# Patient Record
Sex: Male | Born: 2000 | Race: Asian | Hispanic: No | Marital: Single | Smoking: Current some day smoker
Health system: Southern US, Community
[De-identification: ages and names within clinical notes are randomized; demographics above are authoritative.]

## PROBLEM LIST (undated history)

## (undated) DIAGNOSIS — Q250 Patent ductus arteriosus: Secondary | ICD-10-CM

## (undated) HISTORY — PX: OTHER SURGICAL HISTORY: SHX169

---
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
## 2019-06-30 ENCOUNTER — Other Ambulatory Visit: Payer: Self-pay

## 2019-06-30 ENCOUNTER — Emergency Department (HOSPITAL_BASED_OUTPATIENT_CLINIC_OR_DEPARTMENT_OTHER)
Admission: EM | Admit: 2019-06-30 | Discharge: 2019-06-30 | Disposition: A | Discharge status: Home / Self Care | Payer: Managed Care, Other (non HMO) | Attending: Emergency Medicine | Admitting: Emergency Medicine

## 2019-06-30 ENCOUNTER — Encounter (HOSPITAL_BASED_OUTPATIENT_CLINIC_OR_DEPARTMENT_OTHER): Payer: Self-pay | Admitting: *Deleted

## 2019-06-30 ENCOUNTER — Emergency Department (HOSPITAL_BASED_OUTPATIENT_CLINIC_OR_DEPARTMENT_OTHER): Payer: Managed Care, Other (non HMO)

## 2019-06-30 DIAGNOSIS — Y92310 Basketball court as the place of occurrence of the external cause: Secondary | ICD-10-CM | POA: Diagnosis not present

## 2019-06-30 DIAGNOSIS — W010XXA Fall on same level from slipping, tripping and stumbling without subsequent striking against object, initial encounter: Secondary | ICD-10-CM | POA: Insufficient documentation

## 2019-06-30 DIAGNOSIS — Y999 Unspecified external cause status: Secondary | ICD-10-CM | POA: Diagnosis not present

## 2019-06-30 DIAGNOSIS — Y9367 Activity, basketball: Secondary | ICD-10-CM | POA: Insufficient documentation

## 2019-06-30 DIAGNOSIS — S93412A Sprain of calcaneofibular ligament of left ankle, initial encounter: Secondary | ICD-10-CM | POA: Diagnosis not present

## 2019-06-30 DIAGNOSIS — S99912A Unspecified injury of left ankle, initial encounter: Secondary | ICD-10-CM | POA: Diagnosis present

## 2019-06-30 HISTORY — DX: Patent ductus arteriosus: Q25.0

## 2019-06-30 NOTE — Discharge Instructions (Addendum)
You may alternate Tylenol 1000 mg every 6 hours as needed for pain and Ibuprofen 800 mg every 8 hours as needed for pain.  Please take Ibuprofen with food.  These medications are over the counter.   You may use crutches as needed for pain.  It is okay to walk on this foot at any time.   You have tiny chip fracture to the lateral malleolus (outside ankle) from an ankle sprain.  You do not need a cast or surgery.   If symptoms or not improving in 1 to 2 weeks, you should follow-up with orthopedics as an outpatient.

## 2019-06-30 NOTE — ED Provider Notes (Signed)
TIME SEEN: 11:37 PM  CHIEF COMPLAINT: Left ankle injury  HPI: Patient is a 18 year old male who presents to the emergency department with left ankle injury.  States he was playing basketball and went for a lay up when he came down on someone else's foot and inverted his left foot and ankle.  Has pain over the left lateral malleolus with mild swelling.  [place] to the ground.  Did not hit his head or lose consciousness.  No neck or back pain.  No chest or abdominal pain.  Has been able to ambulate.  ROS: See HPI Constitutional: no fever  Eyes: no drainage  ENT: no runny nose   Cardiovascular:  no chest pain  Resp: no SOB  GI: no vomiting GU: no dysuria Integumentary: no rash  Allergy: no hives  Musculoskeletal: no leg swelling  Neurological: no slurred speech ROS otherwise negative  PAST MEDICAL HISTORY/PAST SURGICAL HISTORY:  Past Medical History:  Diagnosis Date  . Patent ductus arteriosus     MEDICATIONS:  Prior to Admission medications   Not on File    ALLERGIES:  No Known Allergies  SOCIAL HISTORY:  Social History   Tobacco Use  . Smoking status: Never Smoker  . Smokeless tobacco: Never Used  Substance Use Topics  . Alcohol use: No    FAMILY HISTORY: Family History  Adopted: Yes  Family history unknown: Yes    EXAM: BP 127/72   Pulse 84   Temp 99.2 F (37.3 C) (Oral)   Resp 14   Ht 5' 10.5" (1.791 m)   Wt 54.9 kg   SpO2 100%   BMI 17.12 kg/m  CONSTITUTIONAL: Alert and oriented and responds appropriately to questions. Well-appearing; well-nourished HEAD: Normocephalic, atraumatic EYES: Conjunctivae clear, pupils appear equal ENT: normal nose; moist mucous membranes NECK: Supple, no midline spinal tenderness or step-off or deformity CARD: RRR; chest is nontender to palpation RESP: Normal chest excursion without splinting or tachypnea;  no hypoxia or respiratory distress, speaking full sentences ABD/GI: non-distended; soft, non-tender, no rebound, no  guarding, no peritoneal signs, no hepatosplenomegaly BACK:  The back appears normal and is non-tender to palpation, no midline spinal tenderness or step-off or deformity EXT: Patient is tender to palpation over the lateral malleolus with mild soft tissue swelling without ecchymosis.  No tenderness over the left foot, left knee, left hip.  No significant ligamentous laxity noted to the left ankle.  2+ left DP pulse.  Normal sensation throughout the left leg with normal movement of the left leg.    SKIN: Normal color for age and race; warm; no rash NEURO: Moves all extremities equally, normal speech PSYCH: The patient's mood and manner are appropriate. Grooming and personal hygiene are appropriate.  MEDICAL DECISION MAKING: Patient here with left ankle injury.  Has a tiny avulsion fracture from the inferior tip of the lateral malleolus.  Discussed with patient and family at bedside that this is likely from a sprain.  He has no significant ligamentous laxity on exam.  Neurovascular intact distally.  Will place an air splint and provide crutches for comfort.  Will provide work note for the next several days so that he can rest, keep leg elevated and apply ice as needed.  Recommended alternating Tylenol and Motrin.  No other injury on exam.  Given outpatient orthopedic follow-up if symptoms not improving in 1 to 2 weeks with medical therapy.  At this time, I do not feel there is any life-threatening condition present. I have reviewed and discussed  all results (EKG, imaging, lab, urine as appropriate) and exam findings with patient/family. I have reviewed nursing notes and appropriate previous records.  I feel the patient is safe to be discharged home without further emergent workup and can continue workup as an outpatient as needed. Discussed usual and customary return precautions. Patient/family verbalize understanding and are comfortable with this plan.  Outpatient follow-up has been provided as needed. All  questions have been answered.    SPLINT APPLICATION Date/Time: 11:46 PM Authorized by: Baxter Hire Briteny Fulghum Consent: Verbal consent obtained. Risks and benefits: risks, benefits and alternatives were discussed Consent given by: patient Splint applied by: technician Location details: left ankle Splint type: air splint Supplies used: air splint Post-procedure: The splinted body part was neurovascularly unchanged following the procedure. Patient tolerance: Patient tolerated the procedure well with no immediate complications.     Larry Boyd was evaluated in Emergency Department on 06/30/2019 for the symptoms described in the history of present illness. He was evaluated in the context of the global COVID-19 pandemic, which necessitated consideration that the patient might be at risk for infection with the SARS-CoV-2 virus that causes COVID-19. Institutional protocols and algorithms that pertain to the evaluation of patients at risk for COVID-19 are in a state of rapid change based on information released by regulatory bodies including the CDC and federal and state organizations. These policies and algorithms were followed during the patient's care in the ED.    Markasia Carrol, Layla Maw, DO 06/30/19 2346

## 2019-06-30 NOTE — ED Triage Notes (Signed)
He was playing basketball tonight and twisted his left ankle. Swelling and pain.

## 2020-10-01 ENCOUNTER — Emergency Department (HOSPITAL_BASED_OUTPATIENT_CLINIC_OR_DEPARTMENT_OTHER)
Admission: EM | Admit: 2020-10-01 | Discharge: 2020-10-01 | Disposition: A | Discharge status: Home / Self Care | Payer: Managed Care, Other (non HMO) | Attending: Emergency Medicine | Admitting: Emergency Medicine

## 2020-10-01 ENCOUNTER — Emergency Department (HOSPITAL_BASED_OUTPATIENT_CLINIC_OR_DEPARTMENT_OTHER): Payer: Managed Care, Other (non HMO)

## 2020-10-01 ENCOUNTER — Encounter (HOSPITAL_BASED_OUTPATIENT_CLINIC_OR_DEPARTMENT_OTHER): Payer: Self-pay | Admitting: Emergency Medicine

## 2020-10-01 ENCOUNTER — Other Ambulatory Visit: Payer: Self-pay

## 2020-10-01 DIAGNOSIS — F17200 Nicotine dependence, unspecified, uncomplicated: Secondary | ICD-10-CM | POA: Insufficient documentation

## 2020-10-01 DIAGNOSIS — W010XXA Fall on same level from slipping, tripping and stumbling without subsequent striking against object, initial encounter: Secondary | ICD-10-CM | POA: Diagnosis not present

## 2020-10-01 DIAGNOSIS — S8264XA Nondisplaced fracture of lateral malleolus of right fibula, initial encounter for closed fracture: Secondary | ICD-10-CM | POA: Diagnosis not present

## 2020-10-01 DIAGNOSIS — S99911A Unspecified injury of right ankle, initial encounter: Secondary | ICD-10-CM | POA: Diagnosis present

## 2020-10-01 MED ORDER — HYDROCODONE-ACETAMINOPHEN 5-325 MG PO TABS
1.0000 | ORAL_TABLET | Freq: Once | ORAL | Status: AC
  Administered 2020-10-01: 1 via ORAL
  Filled 2020-10-01: qty 1

## 2020-10-01 MED ORDER — NAPROXEN 500 MG PO TABS: 500.0000 mg | ORAL_TABLET | Freq: Two times a day (BID) | ORAL | 0 refills | Status: DC

## 2020-10-01 MED ORDER — NAPROXEN 500 MG PO TABS: 500.0000 mg | ORAL_TABLET | Freq: Two times a day (BID) | ORAL | 0 refills | Status: AC

## 2020-10-01 NOTE — ED Triage Notes (Signed)
Reports rolling right ankle earlier this morning, states wasn't paying attention while talking with friends. Pain with weight bearing.

## 2020-10-01 NOTE — ED Provider Notes (Signed)
[facility] EMERGENCY DEPARTMENT Provider Note   CSN: [id number] Arrival date & time: 10/01/20  0548     History Chief Complaint  Patient presents with  . Ankle Pain    Larry Boyd is a 20 y.o. male.  HPI     This is a 20 year old male with no significant past medical history who presents with right ankle injury.  Patient reports that he misstepped when leaving work this morning.  He turned his right ankle and fell onto a rock.  He did not hit his head or lose consciousness.  He rates his pain at 4 out of 10 when not weightbearing.  He states significant pain with weightbearing.  No numbness or tingling of the foot.  He has not taken anything for his pain.  Denies knee pain  Past Medical History:  Diagnosis Date  . Patent ductus arteriosus     Patient Active Problem List   Diagnosis Date Noted  . Closed fracture of middle phalanx of second finger of right hand 09/29/2013    Past Surgical History:  Procedure Laterality Date  . PAD closure         Family History  Adopted: Yes  Family history unknown: Yes    Social History   Tobacco Use  . Smoking status: Current Some Day Smoker  . Smokeless tobacco: Never Used  Substance Use Topics  . Alcohol use: No    Home Medications Prior to Admission medications   Medication Sig Start Date End Date Taking? Authorizing Provider  naproxen (NAPROSYN) 500 MG tablet Take 1 tablet (500 mg total) by mouth 2 (two) times daily. 10/01/20   Bryttani Blew, Mayer Masker, MD    Allergies    Patient has no known allergies.  Review of Systems   Review of Systems  Musculoskeletal:       Right ankle pain  Neurological: Negative for weakness and numbness.  All other systems reviewed and are negative.   Physical Exam Updated Vital Signs BP 125/74 (BP Location: Right Arm)   Pulse 76   Temp 98.6 F (37 C) (Oral)   Resp 20   Ht 1.803 m (5\' 11" )   Wt 61.2 kg   SpO2 100%   BMI 18.83 kg/m   Physical Exam Vitals and  nursing note reviewed.  Constitutional:      Appearance: He is well-developed and well-nourished. He is not ill-appearing.  HENT:     Head: Normocephalic and atraumatic.     Mouth/Throat:     Mouth: Mucous membranes are moist.  Eyes:     Pupils: Pupils are equal, round, and reactive to light.  Cardiovascular:     Rate and Rhythm: Normal rate and regular rhythm.  Pulmonary:     Effort: Pulmonary effort is normal. No respiratory distress.  Abdominal:     Palpations: Abdomen is soft.  Musculoskeletal:        General: No edema.     Cervical back: Neck supple.     Comments: Focused examination of the right lower extremity with tenderness palpation over the lateral malleolus, slight deformity noted, abrasion noted over the medial aspect of the foot, 2+ DP pulse, limited range of motion, no proximal fibular tenderness  Lymphadenopathy:     Cervical: No cervical adenopathy.  Skin:    General: Skin is warm and dry.  Neurological:     Mental Status: He is alert and oriented to person, place, and time.  Psychiatric:        Mood and  Affect: Mood and affect and mood normal.     ED Results / Procedures / Treatments   Labs (all labs ordered are listed, but only abnormal results are displayed) Labs Reviewed - No data to display  EKG None  Radiology DG Ankle Complete Right  Result Date: 10/01/2020 CLINICAL DATA:  Right ankle injury. EXAM: RIGHT ANKLE - COMPLETE 3+ VIEW COMPARISON:  None. FINDINGS: Nondisplaced lateral malleolus fracture with soft tissue swelling and ankle joint effusion. No subluxation. IMPRESSION: 1. Nondisplaced lateral malleolus fracture. 2. Soft tissue swelling and ankle joint effusion. Electronically Signed   By: Marnee Spring M.D.   On: 10/01/2020 06:31    Procedures Procedures   Medications Ordered in ED Medications  HYDROcodone-acetaminophen (NORCO/VICODIN) 5-325 MG per tablet 1 tablet (1 tablet Oral Given 10/01/20 4765)    ED Course  I have reviewed the  triage vital signs and the nursing notes.  Pertinent labs & imaging results that were available during my care of the patient were reviewed by me and considered in my medical decision making (see chart for details).    MDM Rules/Calculators/A&P                           Patient presents with an injury to the right ankle.  He is overall nontoxic and vital signs are reassuring.  He has some swelling slight deformity over the lateral malleolus.  Sprain versus fracture.  X-rays obtained and independently reviewed by myself.  He has a nondisplaced lateral malleolus fracture below the ankle mortise.  Patient was placed in a cam walker.  Recommended nonweightbearing and crutches until he follows up with sports medicine.  This is likely nonoperative injury.  After history, exam, and medical workup I feel the patient has been appropriately medically screened and is safe for discharge home. Pertinent diagnoses were discussed with the patient. Patient was given return precautions.   Final Clinical Impression(s) / ED Diagnoses Final diagnoses:  Nondisplaced fracture of lateral malleolus of right fibula, initial encounter for closed fracture    Rx / DC Orders ED Discharge Orders         Ordered    naproxen (NAPROSYN) 500 MG tablet  2 times daily,   Status:  Discontinued        10/01/20 0643    naproxen (NAPROSYN) 500 MG tablet  2 times daily        10/01/20 0657           Shon Baton, MD 10/01/20 2315

## 2020-10-01 NOTE — Discharge Instructions (Addendum)
You were seen today and found to have a fracture of the ankle.  It is nondisplaced.  Keep CAM Walker in place and use crutches until you follow-up with sports medicine.  This is a nonoperative fracture.  Take naproxen as needed for pain.

## 2020-10-09 ENCOUNTER — Telehealth: Payer: Self-pay | Admitting: Family Medicine

## 2020-10-09 NOTE — Telephone Encounter (Signed)
Unsuccessful attempt to reach pt for ED f/u appt scheduling--will cb later.  --glh

## 2020-11-02 IMAGING — DX DG ANKLE COMPLETE 3+V*L*
3 series · 3 of 3 positions shown · non-contrast
Comparison: None.

CLINICAL DATA: Twisted left ankle playing basketball tonight. Left
ankle pain and swelling. Initial encounter.

EXAM:
LEFT ANKLE COMPLETE - 3+ VIEW

[ankle ap]
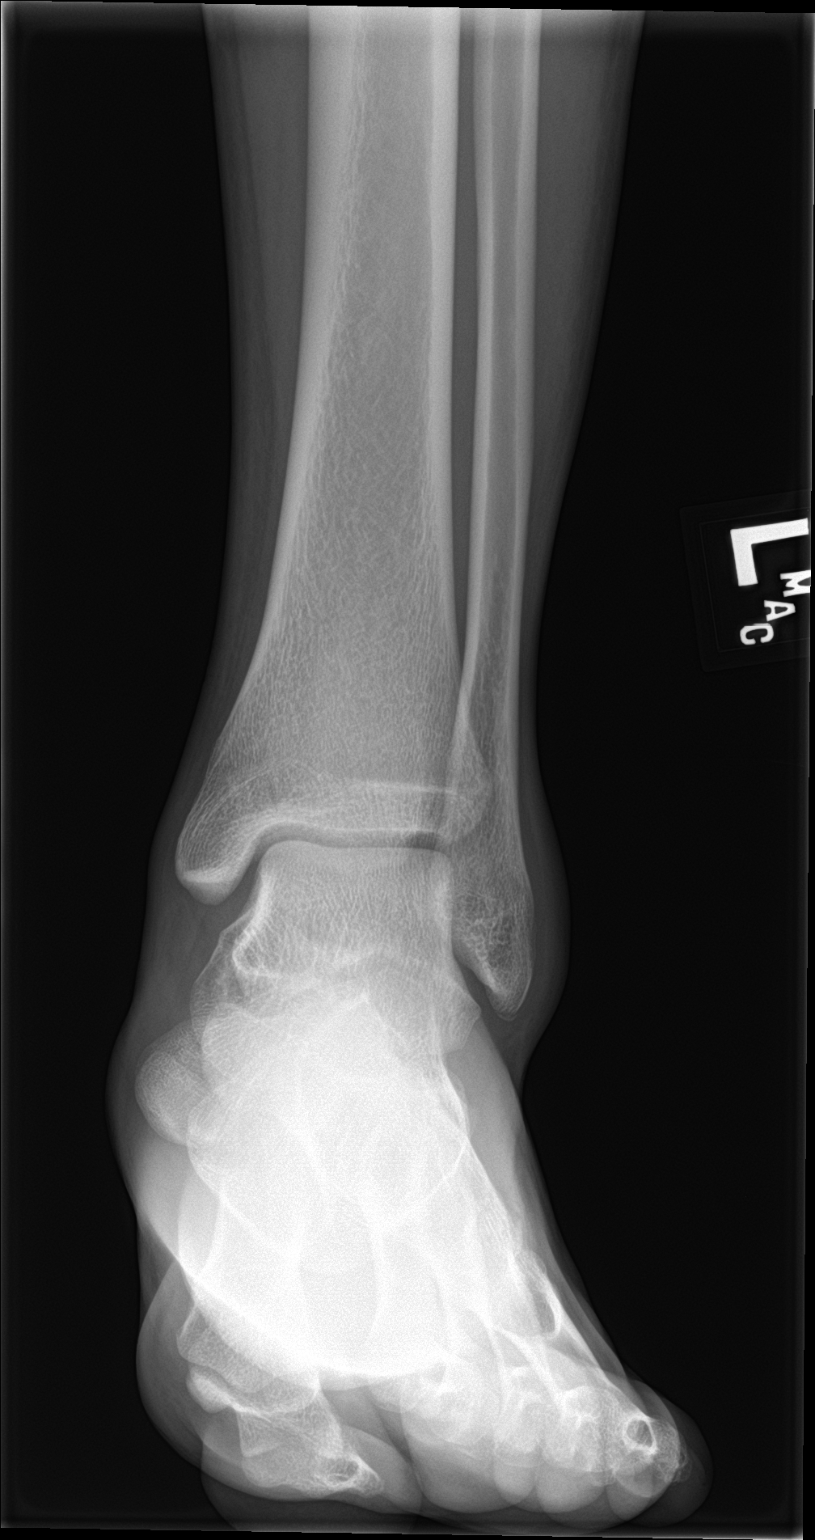

[ankle obl]
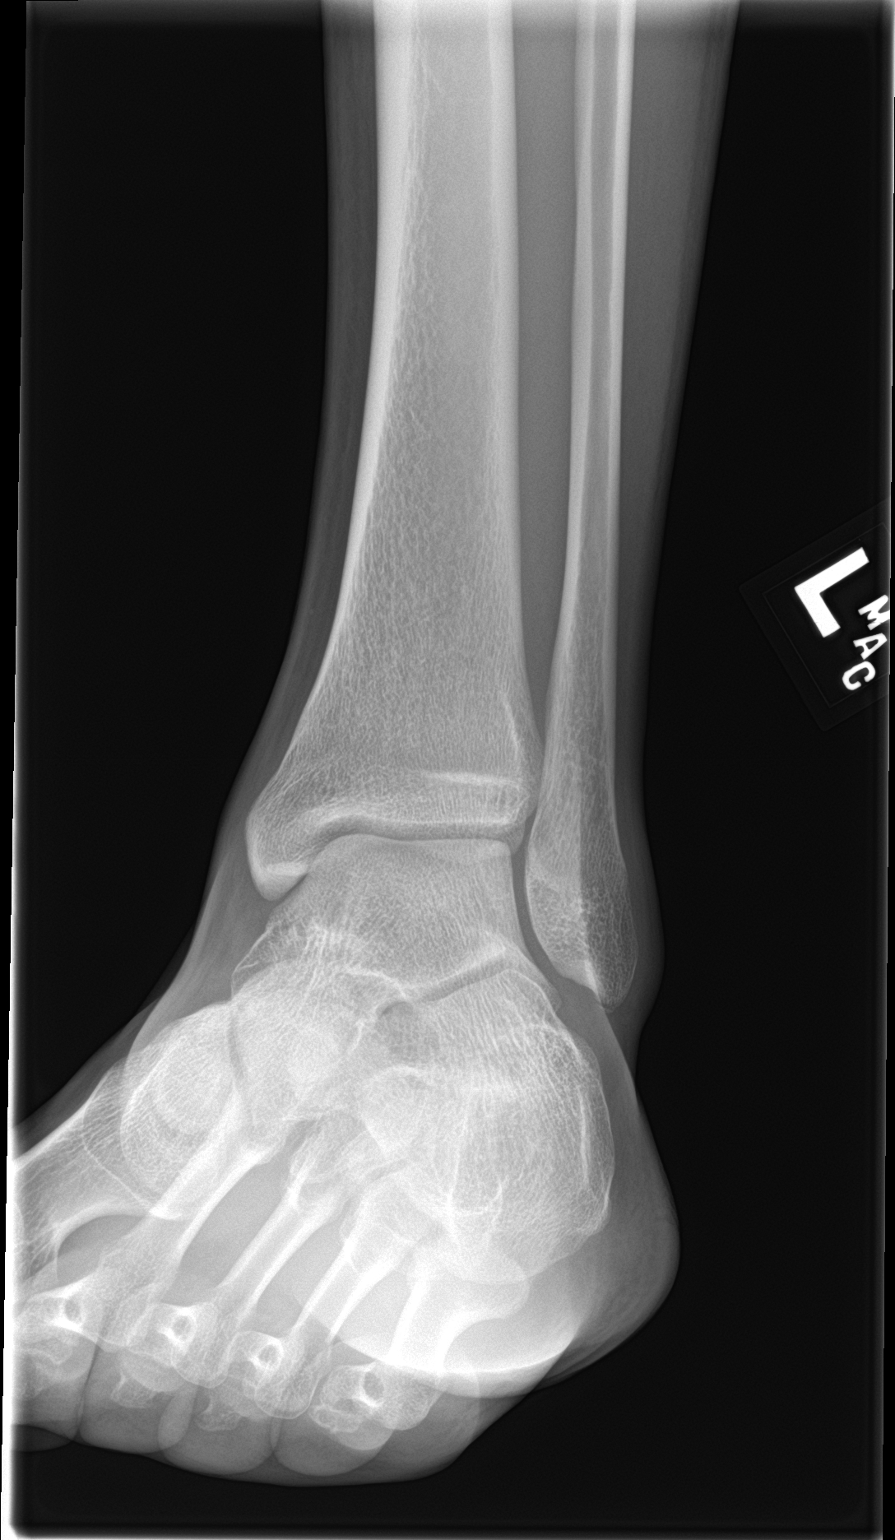

[ankle lat]
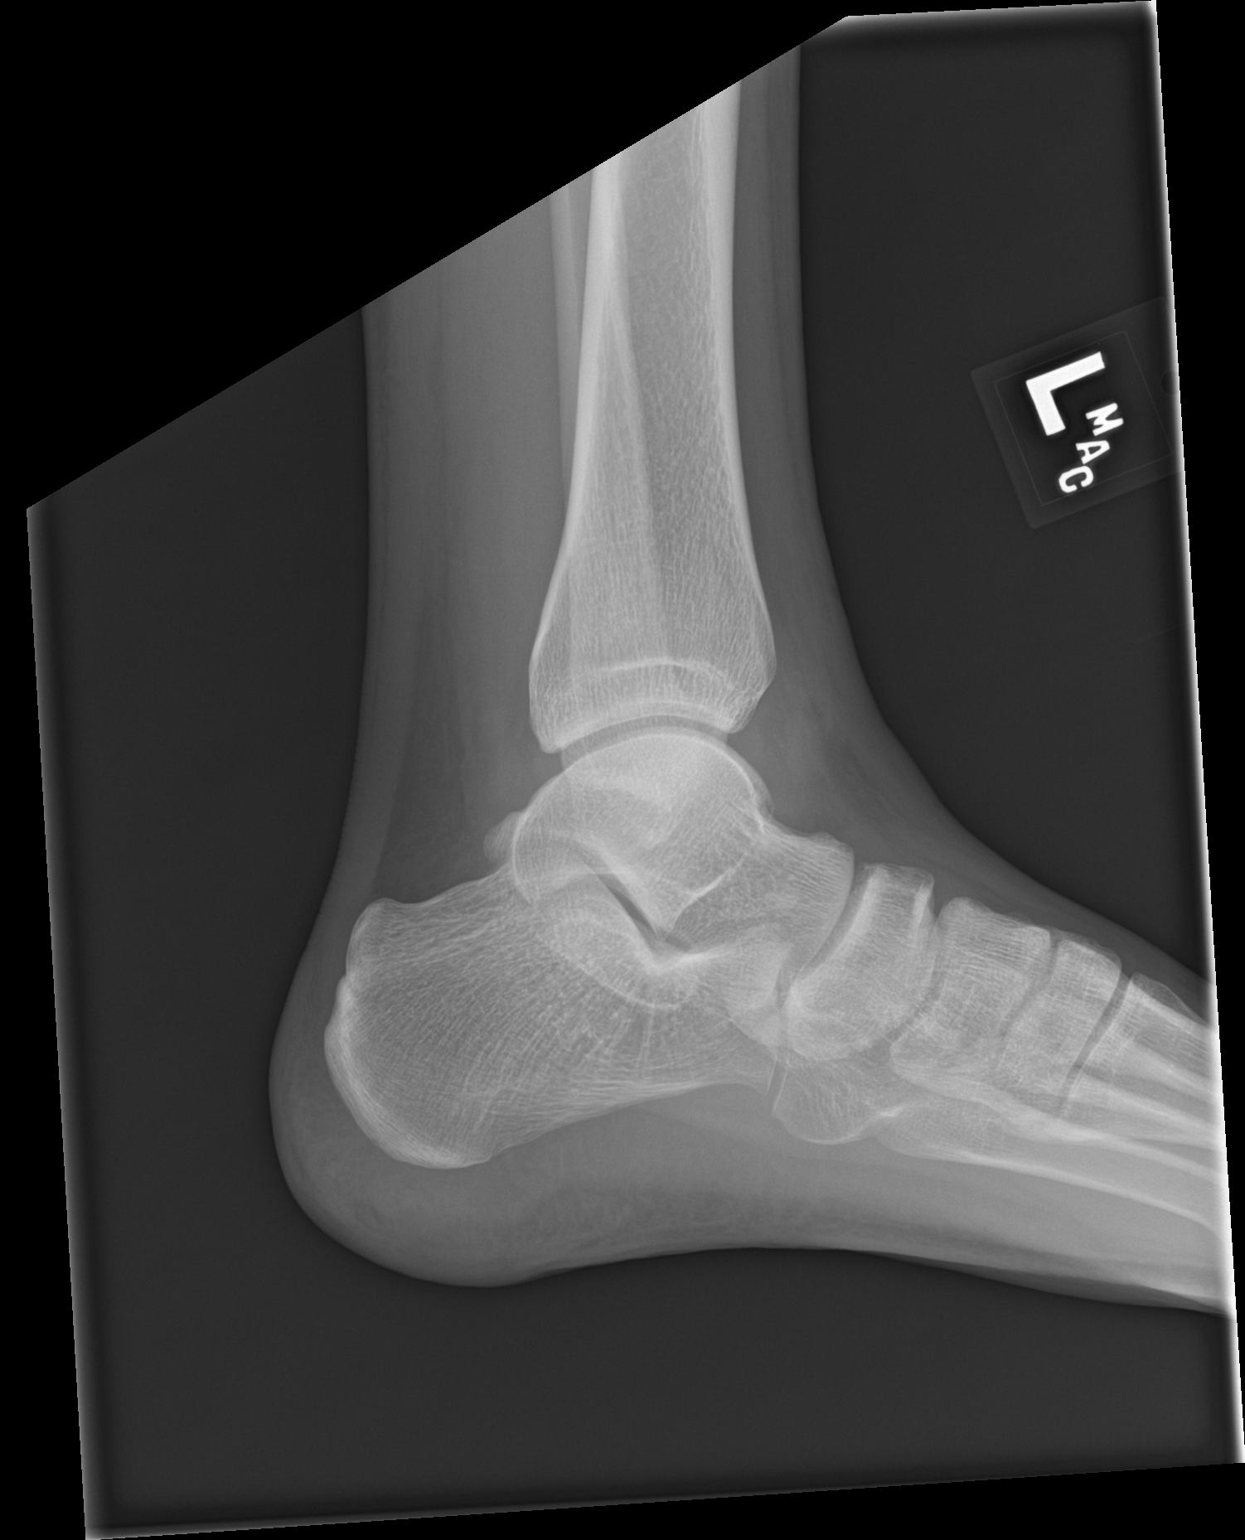

[3 of 3 positions shown; findings below may reference images not displayed]

FINDINGS: A tiny ossific density is seen adjacent to the inferior tip of the
lateral malleolus, consistent with a tiny avulsion fracture
fragment. No other fractures are identified. No evidence of
dislocation. No other bone lesions identified.
IMPRESSION: Tiny avulsion fracture from inferior tip of lateral malleolus.

## 2022-02-04 IMAGING — DX DG ANKLE COMPLETE 3+V*R*
3 series · 3 of 3 positions shown · non-contrast
Comparison: None.

CLINICAL DATA: Right ankle injury.

EXAM:
RIGHT ANKLE - COMPLETE 3+ VIEW

[ankle ap]
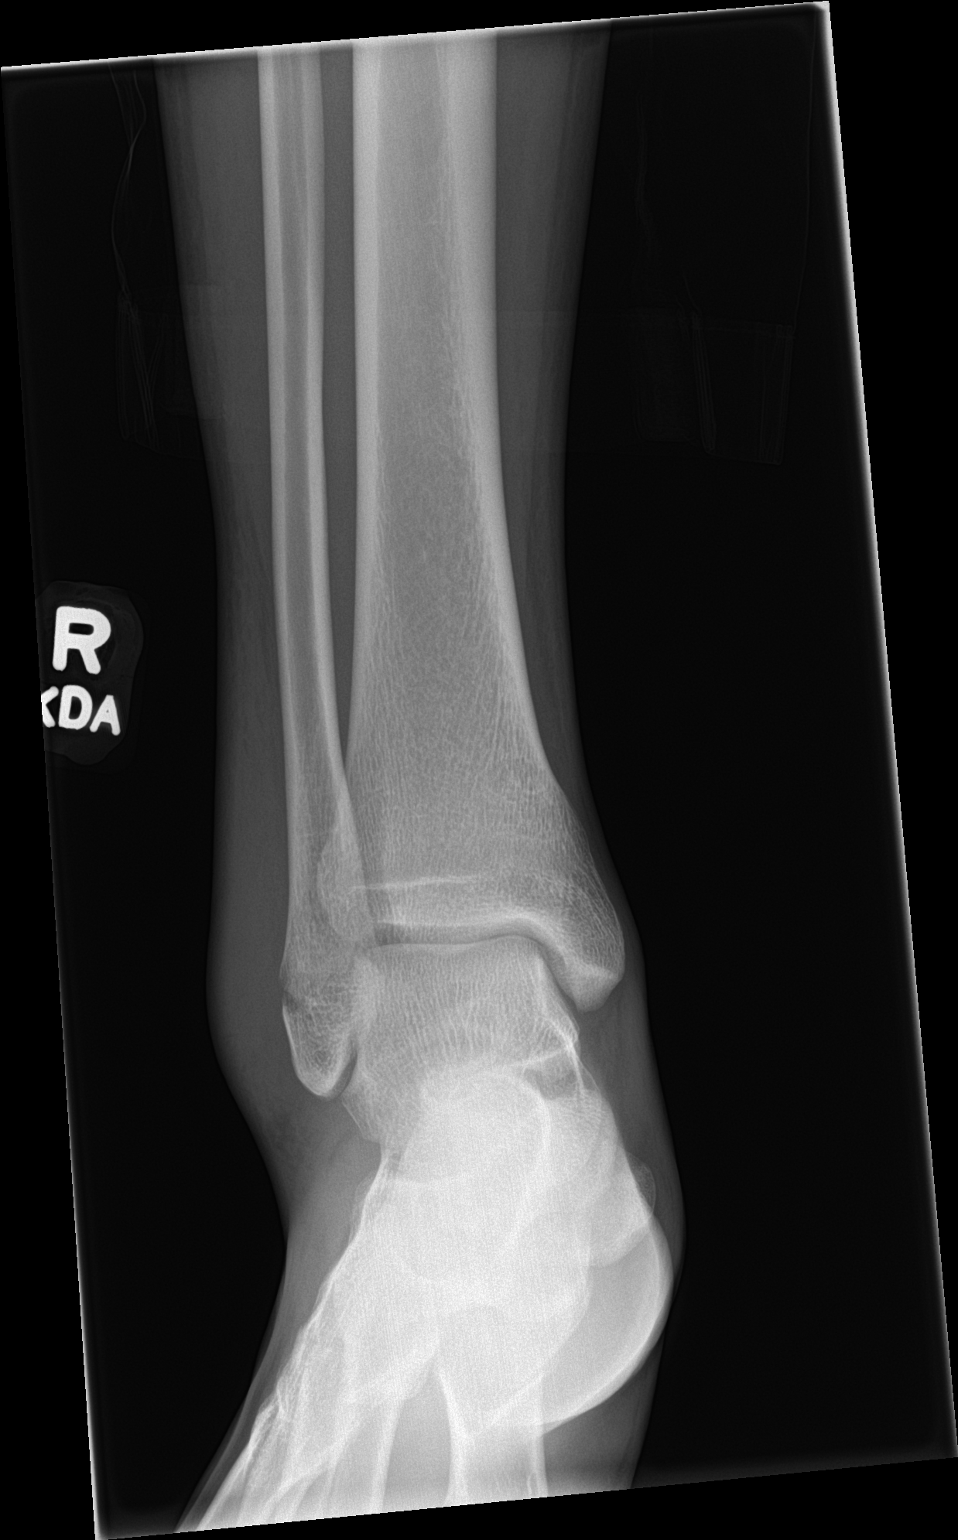

[ankle obl]
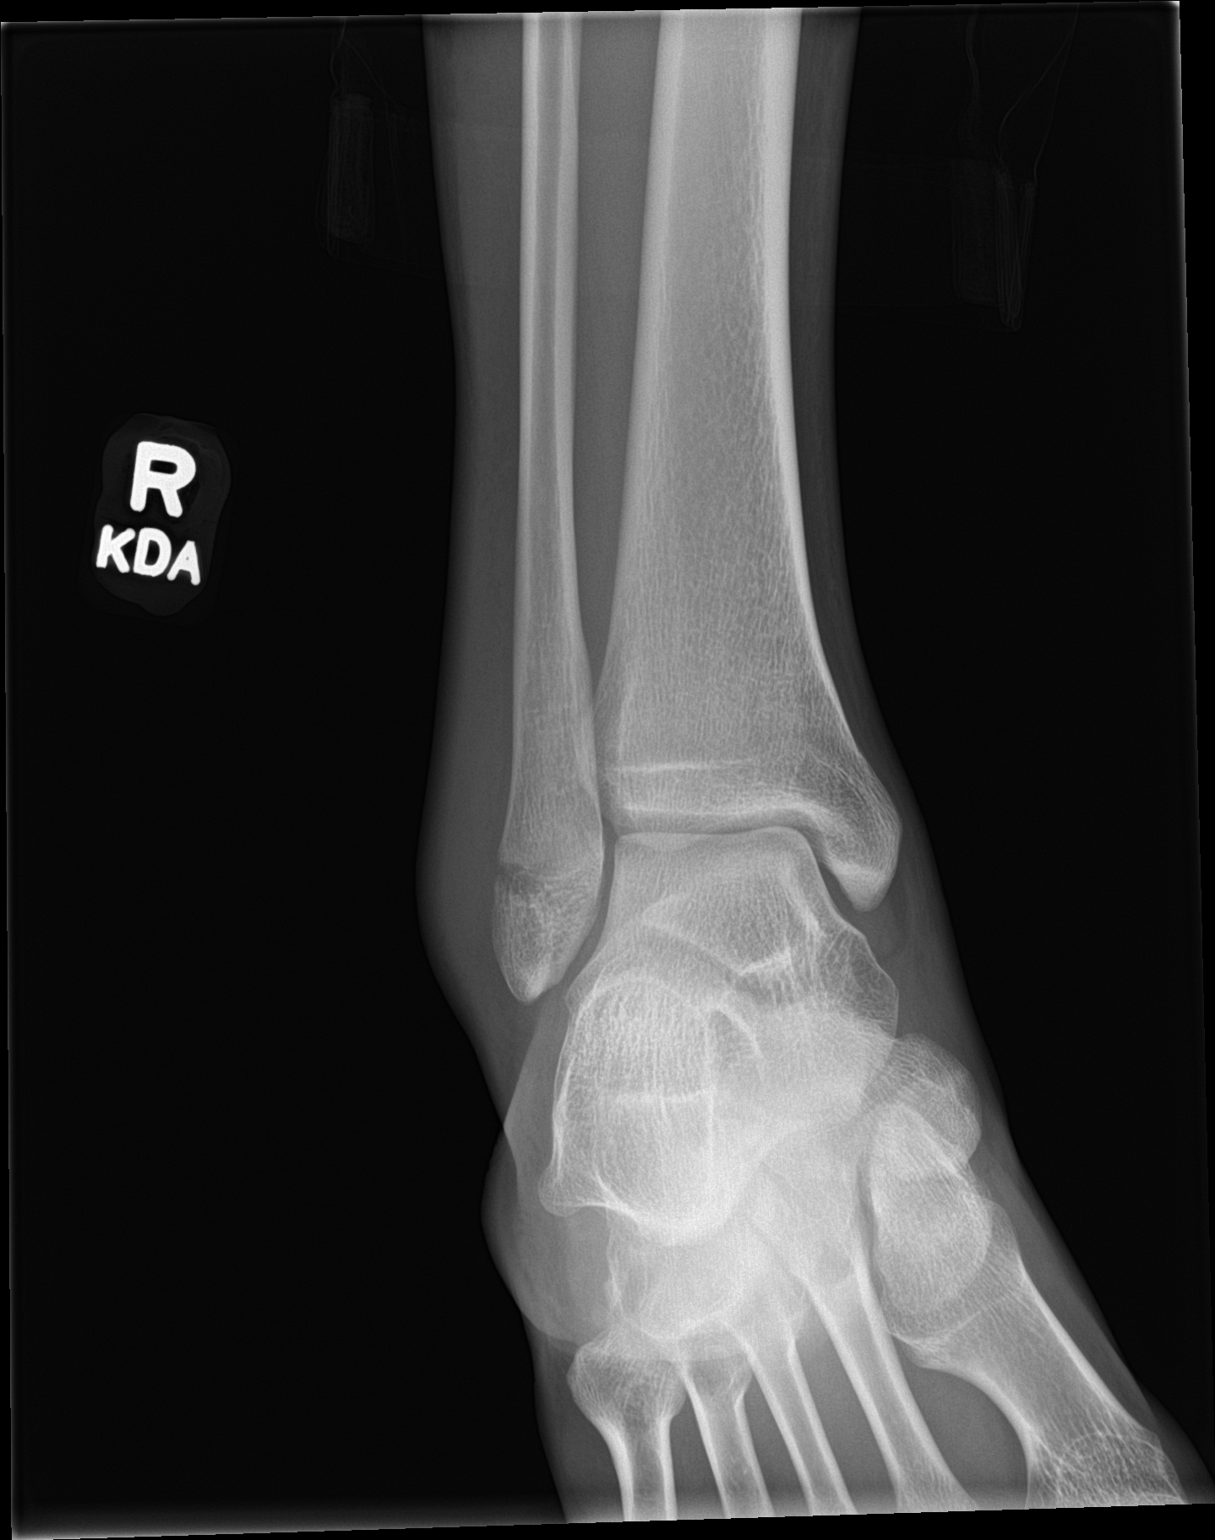

[ankle lat]
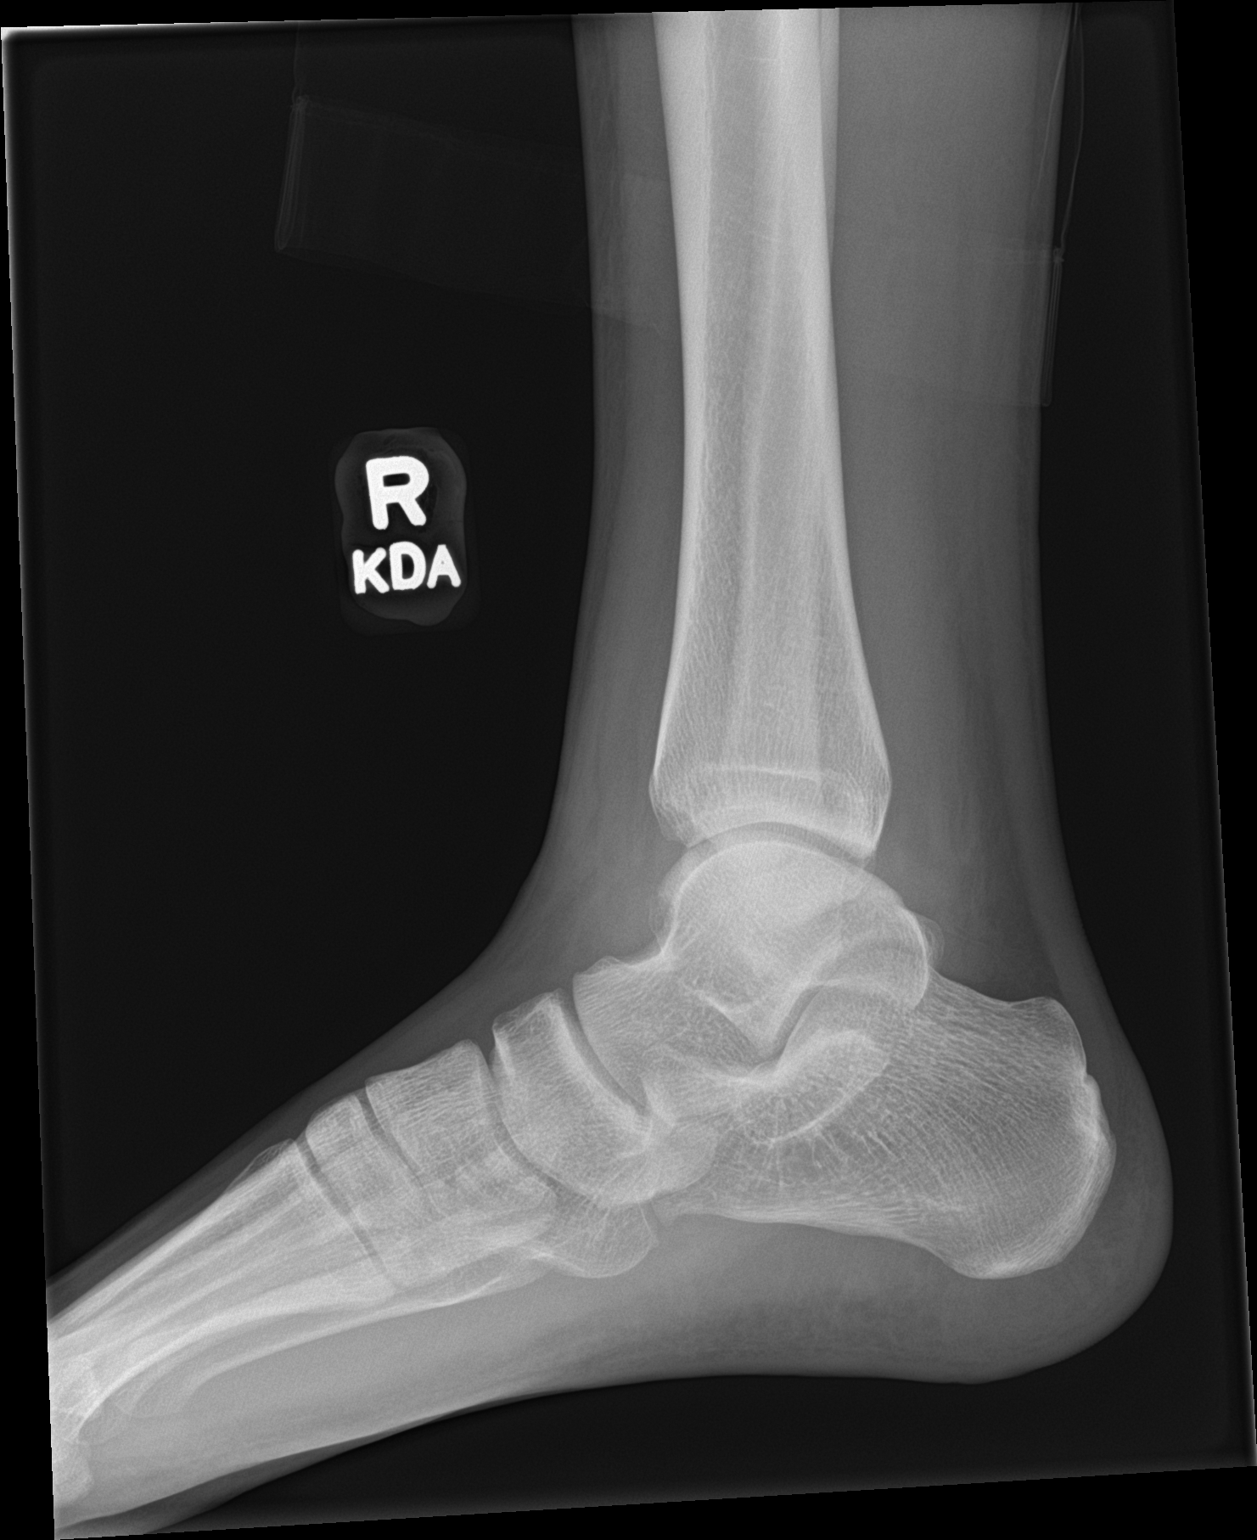

[3 of 3 positions shown; findings below may reference images not displayed]

FINDINGS: Nondisplaced lateral malleolus fracture with soft tissue swelling
and ankle joint effusion. No subluxation.
IMPRESSION: 1. Nondisplaced lateral malleolus fracture.
2. Soft tissue swelling and ankle joint effusion.

## 2022-12-18 ENCOUNTER — Encounter: Payer: Self-pay | Admitting: *Deleted
